# Patient Record
Sex: Male | Born: 2005 | Race: White | Hispanic: No | Marital: Single | Smoking: Never smoker
Health system: Southern US, Community
[De-identification: ages and names within clinical notes are randomized; demographics above are authoritative.]

## PROBLEM LIST (undated history)

## (undated) DIAGNOSIS — F909 Attention-deficit hyperactivity disorder, unspecified type: Secondary | ICD-10-CM

## (undated) DIAGNOSIS — E669 Obesity, unspecified: Secondary | ICD-10-CM

## (undated) DIAGNOSIS — R569 Unspecified convulsions: Secondary | ICD-10-CM

## (undated) DIAGNOSIS — J45909 Unspecified asthma, uncomplicated: Secondary | ICD-10-CM

---
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
## 2024-06-29 ENCOUNTER — Encounter (HOSPITAL_COMMUNITY): Payer: Self-pay

## 2024-06-29 ENCOUNTER — Ambulatory Visit (HOSPITAL_COMMUNITY): Admission: EM | Admit: 2024-06-29 | Discharge: 2024-06-29 | Disposition: A | Discharge status: Home / Self Care

## 2024-06-29 ENCOUNTER — Ambulatory Visit (HOSPITAL_COMMUNITY)

## 2024-06-29 DIAGNOSIS — R051 Acute cough: Secondary | ICD-10-CM

## 2024-06-29 LAB — POC COVID19/FLU A&B COMBO
Covid Antigen, POC: NEGATIVE
Influenza A Antigen, POC: NEGATIVE
Influenza B Antigen, POC: NEGATIVE

## 2024-06-29 MED ORDER — PROMETHAZINE-DM 6.25-15 MG/5ML PO SYRP: 2.5000 mL | ORAL_SOLUTION | Freq: Every evening | ORAL | 0 refills | Status: AC | PRN

## 2024-06-29 MED ORDER — ALBUTEROL SULFATE (2.5 MG/3ML) 0.083% IN NEBU: 2.5000 mg | INHALATION_SOLUTION | Freq: Four times a day (QID) | RESPIRATORY_TRACT | 12 refills | Status: AC | PRN

## 2024-06-29 MED ORDER — ALBUTEROL SULFATE HFA 108 (90 BASE) MCG/ACT IN AERS: 1.0000 | INHALATION_SPRAY | Freq: Four times a day (QID) | RESPIRATORY_TRACT | 0 refills | Status: AC | PRN

## 2024-06-29 NOTE — Discharge Instructions (Signed)

## 2024-06-29 NOTE — ED Provider Notes (Signed)
 " [facility]    CSN: [id number] Arrival date & time: 06/29/24  1137      History   Chief Complaint Chief Complaint  Patient presents with   Cough   Fever   Nasal Congestion    HPI Harold Ruiz is a 18 y.o. male.   This 18 year old male is being seen for acute onset of headache, dizziness, fever, nasal congestion, productive cough with chest discomfort and shortness of breath with coughing fits, nausea, vomiting for approximately 5 days.  He has been using his albuterol  inhaler, albuterol  nebulizer, and Vicks vapor rub with minimal improvement of symptoms.     Past Medical History:  Diagnosis Date   ADHD (attention deficit hyperactivity disorder)    Asthma    Obesity    Seizures (HCC)     Patient Active Problem List   Diagnosis Date Noted   Acute respiratory failure with hypoxia (HCC) 03/12/2014   Status asthmaticus    Status asthmaticus 03/11/2014   Delayed milestones 02/17/2012   Obesity (BMI 30.0-34.9) 02/17/2012    History reviewed. No pertinent surgical history.     Home Medications    Prior to Admission medications  Medication Sig Start Date End Date Taking? Authorizing Provider  albuterol  (PROVENTIL ) (2.5 MG/3ML) 0.083% nebulizer solution Take 3 mLs (2.5 mg total) by nebulization every 6 (six) hours as needed for wheezing or shortness of breath. 06/29/24  Yes Journe Hallmark C, FNP  albuterol  (VENTOLIN  HFA) 108 (90 Base) MCG/ACT inhaler Inhale 1-2 puffs into the lungs every 6 (six) hours as needed for wheezing or shortness of breath. 06/29/24  Yes Everrett Lacasse C, FNP  promethazine -dextromethorphan (PROMETHAZINE -DM) 6.25-15 MG/5ML syrup Take 2.5 mLs by mouth at bedtime as needed for cough. 06/29/24  Yes Jasmyn Picha C, FNP  acetaminophen  (TYLENOL ) 500 MG tablet Take 500 mg by mouth every 6 (six) hours as needed for moderate pain.    [provider]  beclomethasone (QVAR ) 40 MCG/ACT inhaler Inhale 2 puffs into the lungs 2 (two)  times daily. Patient not taking: Reported on 06/18/2021 03/14/14   Lynette Norris, MD  budesonide-formoterol (SYMBICORT) 80-4.5 MCG/ACT inhaler 2 puffs daily as needed (wheezing). 10/31/19   [provider]  cetirizine  (ZYRTEC ) 10 MG tablet Take 1 tablet (10 mg total) by mouth at bedtime. Patient not taking: Reported on 06/29/2024 05/20/21   Eilleen Colander, NP  fluticasone  (FLONASE ) 50 MCG/ACT nasal spray Place 2 sprays into both nostrils daily. Patient not taking: Reported on 06/18/2021 05/20/21   Eilleen Colander, NP  oseltamivir  (TAMIFLU ) 75 MG capsule Take 1 capsule (75 mg total) by mouth every 12 (twelve) hours. Patient not taking: Reported on 06/29/2024 06/18/21   Donzetta Bernardino PARAS, MD    Family History Family History  Problem Relation Age of Onset   Cancer Mother    Asthma Father     Social History Social History[1]   Allergies   Patient has no known allergies.   Review of Systems Review of Systems  Constitutional:  Positive for activity change, appetite change, chills and fever.  HENT:  Positive for congestion, sore throat and voice change. Negative for ear pain.   Respiratory:  Positive for cough and shortness of breath.   Cardiovascular:  Positive for chest pain.  Gastrointestinal:  Positive for nausea and vomiting. Negative for abdominal pain and diarrhea.  Musculoskeletal:  Positive for myalgias.  Skin:  Negative for color change and rash.  Neurological:  Positive for dizziness and headaches.     Physical  Exam Triage Vital Signs ED Triage Vitals  Encounter Vitals Group     BP      Girls Systolic BP Percentile      Girls Diastolic BP Percentile      Boys Systolic BP Percentile      Boys Diastolic BP Percentile      Pulse      Resp      Temp      Temp src      SpO2      Weight      Height      Head Circumference      Peak Flow      Pain Score      Pain Loc      Pain Education      Exclude from Growth Chart    No data found.  Updated Vital  Signs BP 106/65 (BP Location: Left Arm)   Pulse 99   Temp 99.6 F (37.6 C) (Oral)   Resp 16   SpO2 94%   Visual Acuity Right Eye Distance:   Left Eye Distance:   Bilateral Distance:    Right Eye Near:   Left Eye Near:    Bilateral Near:     Physical Exam Vitals and nursing note reviewed.  Constitutional:      General: He is not in acute distress.    Appearance: He is well-developed. He is not ill-appearing or toxic-appearing.     Comments: Pleasant male appearing stated age found sitting in chair in no acute distress.  HENT:     Head: Normocephalic and atraumatic.     Right Ear: Tympanic membrane and external ear normal.     Left Ear: Tympanic membrane and external ear normal.     Nose: Congestion present.     Mouth/Throat:     Lips: Pink.     Mouth: Mucous membranes are moist.     Pharynx: No oropharyngeal exudate or posterior oropharyngeal erythema.  Eyes:     Conjunctiva/sclera: Conjunctivae normal.  Cardiovascular:     Rate and Rhythm: Normal rate and regular rhythm.     Heart sounds: Normal heart sounds. No murmur heard. Pulmonary:     Effort: Pulmonary effort is normal. No respiratory distress.     Breath sounds: Normal breath sounds.  Abdominal:     General: Bowel sounds are normal.     Palpations: Abdomen is soft.     Tenderness: There is no abdominal tenderness.  Skin:    General: Skin is warm and dry.     Capillary Refill: Capillary refill takes less than 2 seconds.  Neurological:     Mental Status: He is alert.  Psychiatric:        Mood and Affect: Mood normal.      UC Treatments / Results  Labs (all labs ordered are listed, but only abnormal results are displayed) Labs Reviewed  POC COVID19/FLU A&B COMBO    EKG   Radiology DG Chest 2 View Result Date: 06/29/2024 CLINICAL DATA:  Cough EXAM: CHEST - 2 VIEW COMPARISON:  May 20, 2021 FINDINGS: The heart size and mediastinal contours are within normal limits. Both lungs are clear. The  visualized skeletal structures are unremarkable. IMPRESSION: No active cardiopulmonary disease. Electronically Signed   By: Lynwood Landy Raddle M.D.   On: 06/29/2024 15:42    Procedures Procedures (including critical care time)  Medications Ordered in UC Medications - No data to display  Initial Impression / Assessment and Plan / UC  Course  I have reviewed the triage vital signs and the nursing notes.  Pertinent labs & imaging results that were available during my care of the patient were reviewed by me and considered in my medical decision making (see chart for details).     Vitals and triage reviewed, patient is hemodynamically stable.  COVID/flu swab negative.  Chest x-ray negative for acute findings..  Presentation consistent with viral respiratory illness.  Prescriptions for albuterol  inhaler and nebulizer solution provided, and promethazine -DM.  Advised supportive care with rest, fluids, guaifenesin, Tylenol 's/ibuprofen .  Plan of care, follow-up care, return precautions given, no questions at this time. Final Clinical Impressions(s) / UC Diagnoses   Final diagnoses:  Acute cough     Discharge Instructions      You have a viral illness which will improve on its own with rest, fluids, and medications to help with your symptoms.  Tylenol , guaifenesin (plain mucinex), and saline nasal sprays may help relieve symptoms.   Two teaspoons of honey in 1 cup of warm water every 4-6 hours may help with throat pains.  Humidifier in room at nighttime may help soothe cough (clean well daily).   Take Promethazine  DM cough medication to help with your cough at nighttime so that you are able to sleep. Do not drive, drink alcohol, or go to work while taking this medication since it can make you sleepy. Only take this at nighttime.   For chest pain, shortness of breath, inability to keep food or fluids down without vomiting, fever that does not respond to tylenol  or motrin , or any other severe  symptoms, please go to the ER for further evaluation. Return to urgent care as needed, otherwise follow-up with PCP.       ED Prescriptions     Medication Sig Dispense Auth. Provider   albuterol  (VENTOLIN  HFA) 108 (90 Base) MCG/ACT inhaler Inhale 1-2 puffs into the lungs every 6 (six) hours as needed for wheezing or shortness of breath. 1 each Lennice Jon BROCKS, FNP   albuterol  (PROVENTIL ) (2.5 MG/3ML) 0.083% nebulizer solution Take 3 mLs (2.5 mg total) by nebulization every 6 (six) hours as needed for wheezing or shortness of breath. 75 mL Benz Vandenberghe C, FNP   promethazine -dextromethorphan (PROMETHAZINE -DM) 6.25-15 MG/5ML syrup Take 2.5 mLs by mouth at bedtime as needed for cough. 118 mL Harjit Douds C, FNP      PDMP not reviewed this encounter.    [1]  Social History Tobacco Use   Smoking status: Never   Tobacco comments:    Mom states she knows what it does to him when asked if she wants to watch cessation video  Vaping Use   Vaping status: Never Used  Substance Use Topics   Alcohol use: No   Drug use: Never     Lennice Jon BROCKS, FNP 06/29/24 1551  "

## 2024-06-29 NOTE — ED Triage Notes (Addendum)
 Patient reports tht he has had a fever, a productive cough with blood-tinged green sputum, and nasal congestion. Patient states, I cough so hard that I vomit.  Patient has used Vick's Vapor rub and an Albuterol  inhaler. And nebulizer.   Patient is requesting refills for Albuterol  inhaler and albuterol  for nebulizer as well.

## 2024-08-09 ENCOUNTER — Ambulatory Visit (HOSPITAL_COMMUNITY)
Admission: EM | Admit: 2024-08-09 | Discharge: 2024-08-09 | Disposition: A | Discharge status: Home / Self Care | Source: Home / Self Care | Attending: Family Medicine | Admitting: Family Medicine

## 2024-08-09 ENCOUNTER — Other Ambulatory Visit: Payer: Self-pay

## 2024-08-09 ENCOUNTER — Encounter (HOSPITAL_COMMUNITY): Payer: Self-pay | Admitting: Emergency Medicine

## 2024-08-09 DIAGNOSIS — B9689 Other specified bacterial agents as the cause of diseases classified elsewhere: Secondary | ICD-10-CM

## 2024-08-09 DIAGNOSIS — L089 Local infection of the skin and subcutaneous tissue, unspecified: Secondary | ICD-10-CM

## 2024-08-09 MED ORDER — MUPIROCIN 2 % EX OINT: 1.0000 | TOPICAL_OINTMENT | Freq: Three times a day (TID) | CUTANEOUS | 0 refills | Status: AC

## 2024-08-09 MED ORDER — DOXYCYCLINE HYCLATE 100 MG PO CAPS: 100.0000 mg | ORAL_CAPSULE | Freq: Two times a day (BID) | ORAL | 0 refills | Status: AC

## 2024-08-09 NOTE — ED Notes (Signed)
 Instructions to patient and mother, states understanding

## 2024-08-09 NOTE — ED Triage Notes (Addendum)
 Reports navel is protruding and leaking per patient.  This was noticed last night.  There is clear liquid draining from naveland slight redness around the area
